# Patient Record
Sex: Female | Born: 1960
Health system: Southern US, Community
[De-identification: ages and names within clinical notes are randomized; demographics above are authoritative.]

## PROBLEM LIST (undated history)

## (undated) HISTORY — PX: BREAST BIOPSY: SHX20

---

## 2019-07-09 ENCOUNTER — Ambulatory Visit: Payer: Self-pay | Attending: Internal Medicine

## 2019-07-09 DIAGNOSIS — Z23 Encounter for immunization: Secondary | ICD-10-CM

## 2019-07-09 NOTE — Progress Notes (Signed)
   Covid-19 Vaccination Clinic  Name:  Nyara Capell    MRN: [id number] DOB: 1961/03/11  07/09/2019  Ms. Rodenberg was observed post Covid-19 immunization for 15 minutes without incident. She was provided with Vaccine Information Sheet and instruction to access the V-Safe system.   Ms. Statzer was instructed to call 911 with any severe reactions post vaccine: Marland Kitchen Difficulty breathing  . Swelling of face and throat  . A fast heartbeat  . A bad rash all over body  . Dizziness and weakness   Immunizations Administered    Name Date Dose VIS Date Route   Pfizer COVID-19 Vaccine 07/09/2019  4:03 PM 0.3 mL 04/01/2019 Intramuscular   Manufacturer: [employer], [employer]   Lot: [id number]   NDC: [address]-2

## 2019-07-30 ENCOUNTER — Ambulatory Visit: Payer: Self-pay | Attending: Internal Medicine

## 2019-07-30 DIAGNOSIS — Z23 Encounter for immunization: Secondary | ICD-10-CM

## 2019-07-30 NOTE — Progress Notes (Signed)
   Covid-19 Vaccination Clinic  Name:  Amy Nash    MRN: [id number] DOB: 1960-07-07  07/30/2019  [name] was observed post Covid-19 immunization for 15 minutes without incident. She was provided with Vaccine Information Sheet and instruction to access the V-Safe system.   [name] was instructed to call 911 with any severe reactions post vaccine: Marland Kitchen Difficulty breathing  . Swelling of face and throat  . A fast heartbeat  . A bad rash all over body  . Dizziness and weakness   Immunizations Administered    Name Date Dose VIS Date Route   Pfizer COVID-19 Vaccine 07/30/2019  2:39 PM 0.3 mL 04/01/2019 Intramuscular   Manufacturer: [employer], [employer]   Lot: [id number]   NDC: [address]-6

## 2021-08-20 ENCOUNTER — Other Ambulatory Visit: Payer: Self-pay | Admitting: Family Medicine

## 2021-08-20 DIAGNOSIS — Z1231 Encounter for screening mammogram for malignant neoplasm of breast: Secondary | ICD-10-CM

## 2021-09-09 ENCOUNTER — Ambulatory Visit
Admission: RE | Admit: 2021-09-09 | Discharge: 2021-09-09 | Disposition: A | Discharge status: Home / Self Care | Payer: BC Managed Care – PPO | Source: Ambulatory Visit | Attending: Family Medicine | Admitting: Family Medicine

## 2021-09-09 DIAGNOSIS — Z1231 Encounter for screening mammogram for malignant neoplasm of breast: Secondary | ICD-10-CM | POA: Diagnosis present

## 2021-09-12 ENCOUNTER — Other Ambulatory Visit: Payer: Self-pay | Admitting: Family Medicine

## 2021-09-12 DIAGNOSIS — R928 Other abnormal and inconclusive findings on diagnostic imaging of breast: Secondary | ICD-10-CM

## 2021-09-12 DIAGNOSIS — N63 Unspecified lump in unspecified breast: Secondary | ICD-10-CM

## 2021-09-19 ENCOUNTER — Ambulatory Visit
Admission: RE | Admit: 2021-09-19 | Discharge: 2021-09-19 | Disposition: A | Discharge status: Home / Self Care | Payer: BC Managed Care – PPO | Source: Ambulatory Visit | Attending: Family Medicine | Admitting: Family Medicine

## 2021-09-19 DIAGNOSIS — N63 Unspecified lump in unspecified breast: Secondary | ICD-10-CM | POA: Diagnosis present

## 2021-09-19 DIAGNOSIS — R928 Other abnormal and inconclusive findings on diagnostic imaging of breast: Secondary | ICD-10-CM | POA: Insufficient documentation

## 2021-09-23 ENCOUNTER — Other Ambulatory Visit: Payer: Self-pay | Admitting: Family Medicine

## 2021-09-23 DIAGNOSIS — N63 Unspecified lump in unspecified breast: Secondary | ICD-10-CM

## 2021-09-23 DIAGNOSIS — R928 Other abnormal and inconclusive findings on diagnostic imaging of breast: Secondary | ICD-10-CM

## 2021-10-07 ENCOUNTER — Other Ambulatory Visit: Payer: BC Managed Care – PPO

## 2021-10-17 ENCOUNTER — Ambulatory Visit
Admission: RE | Admit: 2021-10-17 | Discharge: 2021-10-17 | Disposition: A | Discharge status: Home / Self Care | Payer: BC Managed Care – PPO | Source: Ambulatory Visit | Attending: Family Medicine | Admitting: Family Medicine

## 2021-10-17 DIAGNOSIS — R928 Other abnormal and inconclusive findings on diagnostic imaging of breast: Secondary | ICD-10-CM

## 2021-10-17 DIAGNOSIS — N63 Unspecified lump in unspecified breast: Secondary | ICD-10-CM

## 2021-10-18 LAB — SURGICAL PATHOLOGY

## 2022-10-15 ENCOUNTER — Other Ambulatory Visit: Payer: Self-pay | Admitting: Family Medicine

## 2022-10-15 DIAGNOSIS — Z1231 Encounter for screening mammogram for malignant neoplasm of breast: Secondary | ICD-10-CM

## 2022-10-31 ENCOUNTER — Ambulatory Visit
Admission: RE | Admit: 2022-10-31 | Discharge: 2022-10-31 | Disposition: A | Discharge status: Home / Self Care | Payer: BC Managed Care – PPO | Source: Ambulatory Visit | Attending: Family Medicine | Admitting: Family Medicine

## 2022-10-31 DIAGNOSIS — Z1231 Encounter for screening mammogram for malignant neoplasm of breast: Secondary | ICD-10-CM | POA: Diagnosis present

## 2024-04-09 IMAGING — MG MM DIGITAL DIAGNOSTIC UNILAT*L* W/ TOMO W/ CAD
4 series · 4 of 12 positions shown · non-contrast
Comparison: Previous exam(s).

CLINICAL DATA: Callback for LEFT breast mass from baseline
mammogram

EXAM:
DIGITAL DIAGNOSTIC UNILATERAL LEFT MAMMOGRAM WITH TOMOSYNTHESIS AND
CAD; ULTRASOUND LEFT BREAST LIMITED
TECHNIQUE: Left digital diagnostic mammography and breast tomosynthesis was
performed. The images were evaluated with computer-aided detection.;
Targeted ultrasound examination of the left breast was performed.

[L CC synth-2D]
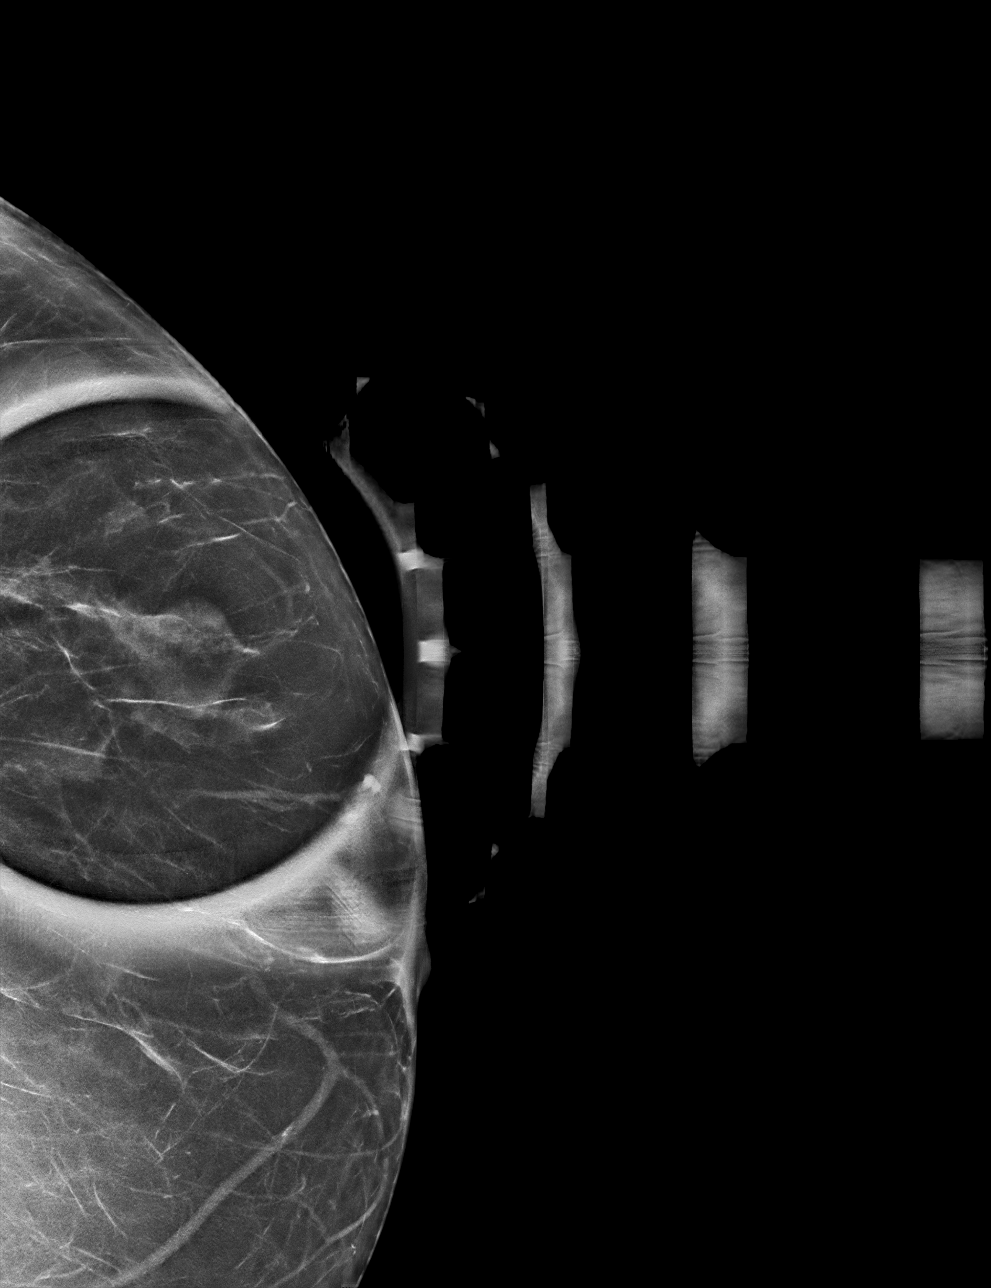

[L MLO synth-2D]
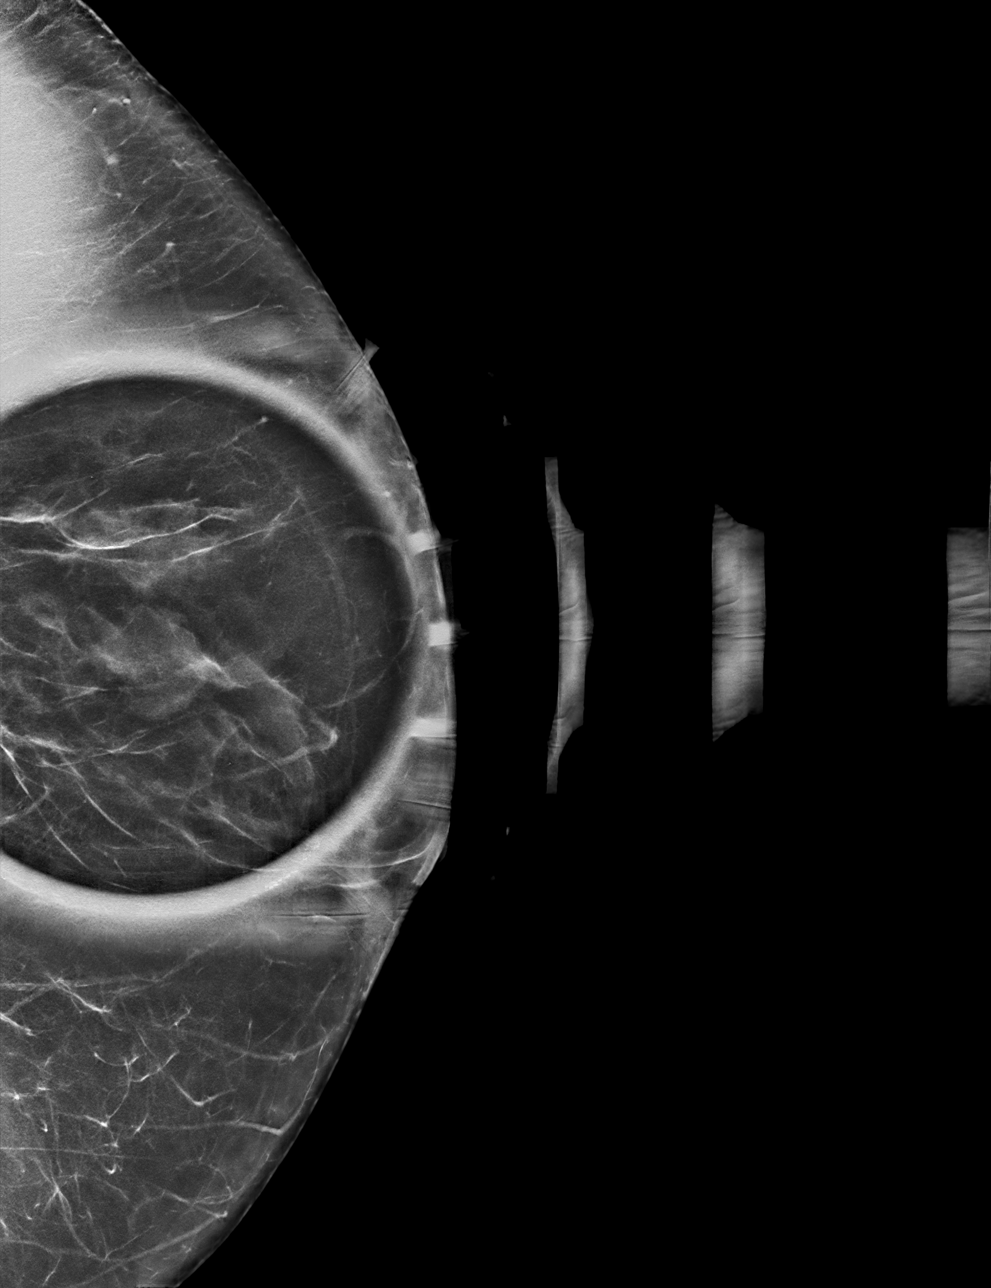

[L CC tomo · tomo slice 30/59.0]
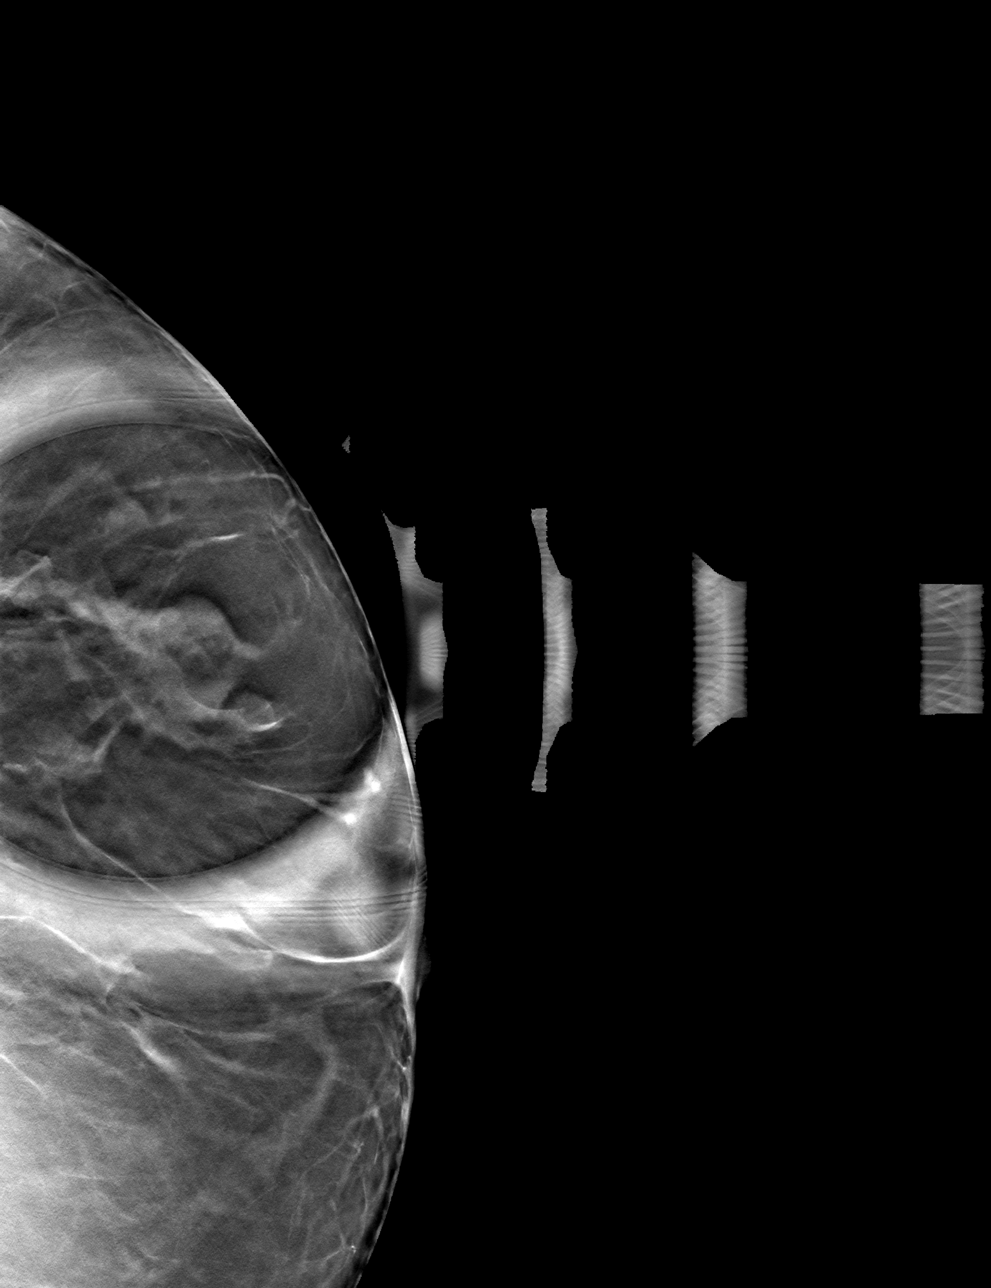

[L MLO tomo · tomo slice 37/73.0]
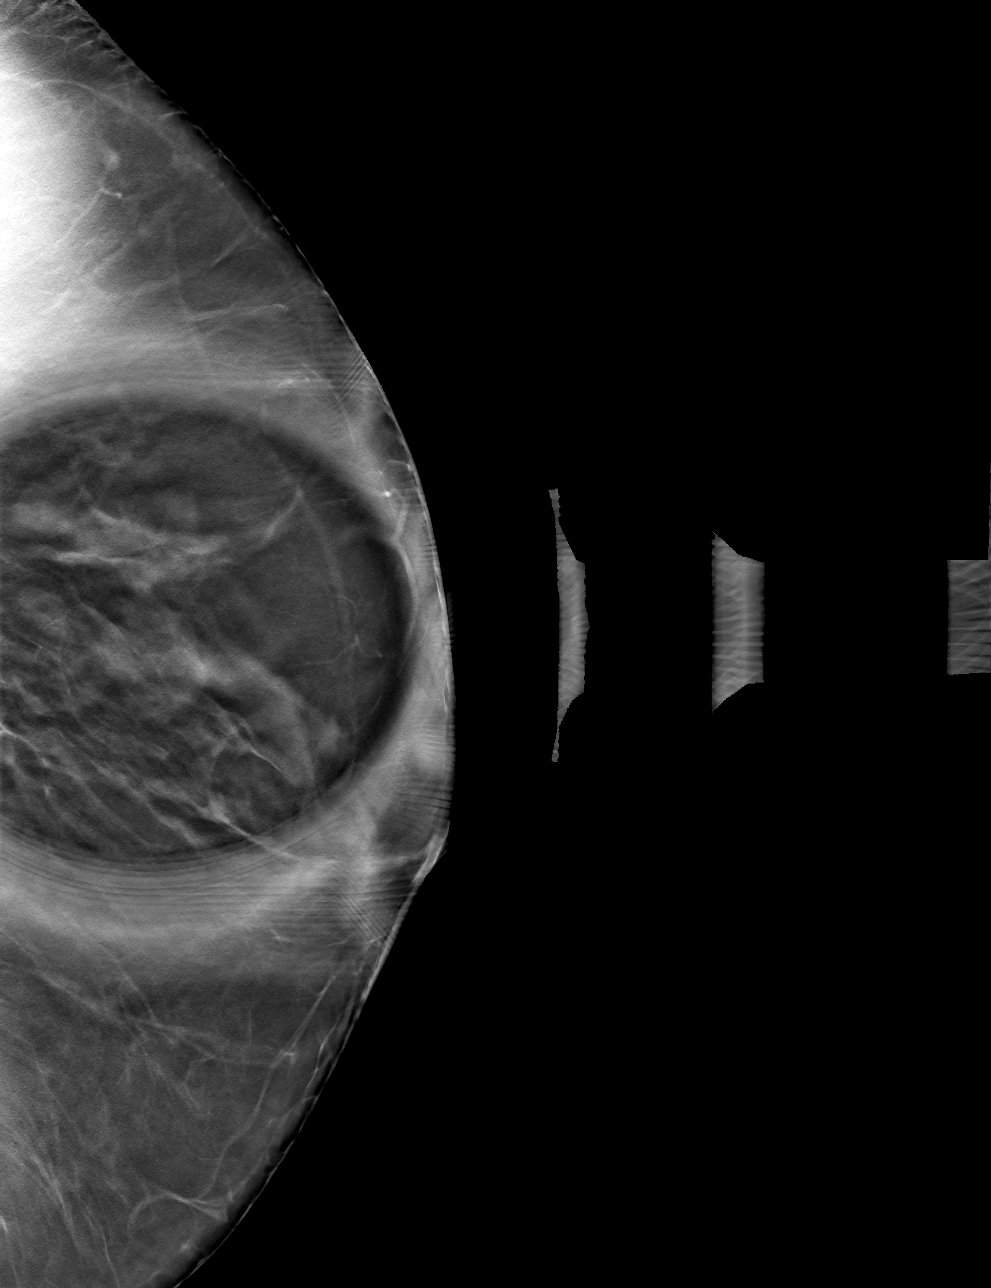

[4 of 12 positions shown; findings below may reference images not displayed]

ACR Breast Density Category b: There are scattered areas of
fibroglandular density.
FINDINGS: Spot compression tomosynthesis views confirm persistence of an oval
obscured mass in the LEFT outer breast at anterior to middle depth.

On physical exam, no suspicious mass is appreciated.

Targeted ultrasound was performed of the LEFT outer breast. At 3
o'clock 5 cm from the nipple, there is an oval mass with indistinct
borders and heterogeneous echogenicity. This measures 2.2 x 1.3 by
1.7 cm. This may reflect a hamartoma or pseudoangiomatous stromal
hyperplasia but is indeterminate.

Targeted ultrasound was performed LEFT axilla. No suspicious
axillary lymph nodes are seen.
IMPRESSION: 1. A 2.2 cm LEFT breast mass at 3 o'clock is indeterminate.
Recommend ultrasound-guided biopsy for definitive characterization
given indistinct margins.
2. No suspicious LEFT axillary adenopathy.

RECOMMENDATION:
LEFT breast ultrasound-guided biopsy x1

I have discussed the findings and recommendations with the patient
in Spanish with the assistance of a Spanish interpreter. The biopsy
procedure was discussed with the patient and questions were
answered. Patient expressed their understanding of the biopsy
recommendation. Patient will be scheduled for biopsy at her earliest
convenience by the schedulers. Ordering provider will be notified.
If applicable, a reminder letter will be sent to the patient
regarding the next appointment.

BI-RADS CATEGORY  4: Suspicious.
